# Patient Record
Sex: Male | Born: 1995 | Race: White | Hispanic: No | Marital: Single | Smoking: Current every day smoker
Health system: Southern US, Community
[De-identification: ages and names within clinical notes are randomized; demographics above are authoritative.]

## PROBLEM LIST (undated history)

## (undated) HISTORY — PX: FINGER SURGERY: SHX640

---
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
## 2013-08-13 ENCOUNTER — Emergency Department (HOSPITAL_COMMUNITY)
Admission: EM | Admit: 2013-08-13 | Discharge: 2013-08-13 | Disposition: A | Discharge status: Home / Self Care | Payer: Medicaid Other | Attending: Emergency Medicine | Admitting: Emergency Medicine

## 2013-08-13 ENCOUNTER — Encounter (HOSPITAL_COMMUNITY): Payer: Self-pay | Admitting: Emergency Medicine

## 2013-08-13 DIAGNOSIS — K089 Disorder of teeth and supporting structures, unspecified: Secondary | ICD-10-CM | POA: Insufficient documentation

## 2013-08-13 DIAGNOSIS — K029 Dental caries, unspecified: Secondary | ICD-10-CM | POA: Insufficient documentation

## 2013-08-13 DIAGNOSIS — K0889 Other specified disorders of teeth and supporting structures: Secondary | ICD-10-CM

## 2013-08-13 DIAGNOSIS — F17200 Nicotine dependence, unspecified, uncomplicated: Secondary | ICD-10-CM | POA: Insufficient documentation

## 2013-08-13 MED ORDER — HYDROCODONE-ACETAMINOPHEN 5-325 MG PO TABS
2.0000 | ORAL_TABLET | Freq: Once | ORAL | Status: AC
  Administered 2013-08-13: 2 via ORAL
  Filled 2013-08-13: qty 2

## 2013-08-13 MED ORDER — NAPROXEN 500 MG PO TABS: 500.0000 mg | ORAL_TABLET | Freq: Two times a day (BID) | ORAL | Status: AC

## 2013-08-13 MED ORDER — TRAMADOL HCL 50 MG PO TABS: 50.0000 mg | ORAL_TABLET | Freq: Four times a day (QID) | ORAL | Status: AC | PRN

## 2013-08-13 MED ORDER — PENICILLIN V POTASSIUM 500 MG PO TABS: 500.0000 mg | ORAL_TABLET | Freq: Four times a day (QID) | ORAL | Status: AC

## 2013-08-13 NOTE — ED Provider Notes (Signed)
CSN: [id number]     Arrival date & time 08/13/13  2202 History   First MD Initiated Contact with Patient 08/13/13 2217     This chart was scribed for non-physician practitioner, Antony Madura, PA-C working with Toy Baker, MD by Arlan Organ, ED Scribe. This patient was seen in room WTR8/WTR8 and the patient's care was started at 10:51 PM.   Chief Complaint  Patient presents with  . Dental Pain   The history is provided by the patient. No language interpreter was used.    HPI Comments: Mitchell Tapia is a 18 y.o. male who presents to the Emergency Department complaining of constant, moderate lower dental pain x 3 days that has progressively worsened. Pt currently describes this pain as sharp and rates it 8/10.  Pt states he broke 2 teeth off while eating a piece of pork chop.  Pain is exacerbated when attempting to eat and cold/hot fluids. He has tried Ibuprofen 800 mg without any improvement for symptoms. No inability to swallow or drooling. At this time he denies any fever, chills, neck pain, neck stiffness. Pt plans to follow up with his dentist this coming Friday. He has no pertinent past medical history. No other concerns this visit.   History reviewed. No pertinent past medical history. Past Surgical History  Procedure Laterality Date  . Finger surgery     No family history on file. History  Substance Use Topics  . Smoking status: Current Every Day Smoker -- 0.50 packs/day  . Smokeless tobacco: Not on file  . Alcohol Use: No    Review of Systems  Constitutional: Negative for fever and chills.  HENT: Positive for dental problem. Negative for trouble swallowing.   Musculoskeletal: Negative for neck pain and neck stiffness.  Psychiatric/Behavioral: Negative for confusion.    Allergies  Review of patient's allergies indicates no known allergies.  Home Medications   Prior to Admission medications   Medication Sig Start Date End Date Taking? Authorizing Provider   ibuprofen (ADVIL,MOTRIN) 800 MG tablet Take 800 mg by mouth every 8 (eight) hours as needed for moderate pain.   Yes Historical Provider, MD  naproxen (NAPROSYN) 500 MG tablet Take 1 tablet (500 mg total) by mouth 2 (two) times daily. 08/13/13   Antony Madura, PA-C  penicillin v potassium (VEETID) 500 MG tablet Take 1 tablet (500 mg total) by mouth 4 (four) times daily. 08/13/13 08/20/13  Antony Madura, PA-C  traMADol (ULTRAM) 50 MG tablet Take 1 tablet (50 mg total) by mouth every 6 (six) hours as needed. 08/13/13   Antony Madura, PA-C   Triage Vitals: BP 120/63  Pulse 95  Temp(Src) 98.6 F (37 C) (Oral)  Resp 18  SpO2 100%   Physical Exam  Nursing note and vitals reviewed. Constitutional: He is oriented to person, place, and time. He appears well-developed and well-nourished. No distress.  Nontoxic/nonseptic appearing  HENT:  Head: Normocephalic and atraumatic.  Mouth/Throat: Uvula is midline, oropharynx is clear and moist and mucous membranes are normal. No trismus in the jaw. Abnormal dentition. Dental caries present. No dental abscesses or uvula swelling. No oropharyngeal exudate.    Multiple dental caries. Uvula midline. Patient tolerating secretions without difficulty or drooling. No trismus or stridor.  Eyes: Conjunctivae and EOM are normal. No scleral icterus.  Neck: Normal range of motion. Neck supple.  No nuchal rigidity or meningismus  Pulmonary/Chest: Effort normal. No respiratory distress.  Musculoskeletal: Normal range of motion.  Neurological: He is alert and oriented to  person, place, and time. He exhibits normal muscle tone. Coordination normal.  GCS 15. Patient moves extremities without ataxia.  Skin: Skin is warm and dry. No rash noted. He is not diaphoretic. No erythema. No pallor.  Psychiatric: He has a normal mood and affect. His behavior is normal.    ED Course  Procedures (including critical care time)  DIAGNOSTIC STUDIES: Oxygen Saturation is 100% on RA, Normal  by my interpretation.    COORDINATION OF CARE: 10:52 PM-Discussed treatment plan with pt at bedside and pt agreed to plan.     Labs Review Labs Reviewed - No data to display  Imaging Review No results found.   EKG Interpretation None      MDM   Final diagnoses:  Dentalgia    Patient with toothache x 3 days. No gross abscess. Exam unconcerning for Ludwig's angina or spread of infection. Will treat with penicillin and pain medicine. Urged patient to follow-up with dentist. Return precautions provided and patient agreeable to plan with no unaddressed concerns.  I personally performed the services described in this documentation, which was scribed in my presence. The recorded information has been reviewed and is accurate.   Filed Vitals:   08/13/13 2211  BP: 120/63  Pulse: 95  Temp: 98.6 F (37 C)  TempSrc: Oral  Resp: 18  SpO2: 100%     Antony MaduraKelly Siobhan Zaro, PA-C 08/13/13 2304

## 2013-08-13 NOTE — ED Notes (Signed)
Pt states he has had pain in his lower incisors since this morning and and upper right jaw pain for the past 3 days. Pt states the teeth in his upper right jaw and his lower incisors have began to crack, causing the pain. Pt states he called his dentist, who will not be able to see him until the end of the week; he was told to come to ED if pain gets worse.

## 2013-08-13 NOTE — Discharge Instructions (Signed)
Take penicillin to cover for infection. Take naproxen as prescribed for pain control. You may take tramadol for breakthrough pain. Follow up with your dentist.  Dental Pain A tooth ache may be caused by cavities (tooth decay). Cavities expose the nerve of the tooth to air and hot or cold temperatures. It may come from an infection or abscess (also called a boil or furuncle) around your tooth. It is also often caused by dental caries (tooth decay). This causes the pain you are having. DIAGNOSIS  Your caregiver can diagnose this problem by exam. TREATMENT   If caused by an infection, it may be treated with medications which kill germs (antibiotics) and pain medications as prescribed by your caregiver. Take medications as directed.  Only take over-the-counter or prescription medicines for pain, discomfort, or fever as directed by your caregiver.  Whether the tooth ache today is caused by infection or dental disease, you should see your dentist as soon as possible for further care. SEEK MEDICAL CARE IF: The exam and treatment you received today has been provided on an emergency basis only. This is not a substitute for complete medical or dental care. If your problem worsens or new problems (symptoms) appear, and you are unable to meet with your dentist, call or return to this location. SEEK IMMEDIATE MEDICAL CARE IF:   You have a fever.  You develop redness and swelling of your face, jaw, or neck.  You are unable to open your mouth.  You have severe pain uncontrolled by pain medicine. MAKE SURE YOU:   Understand these instructions.  Will watch your condition.  Will get help right away if you are not doing well or get worse. Document Released: 01/10/2005 Document Revised: 04/04/2011 Document Reviewed: 08/29/2007 [facility] Patient Information 2015 [place], [place]. This information is not intended to replace advice given to you by your health care provider. Make sure you discuss any questions  you have with your health care provider.

## 2013-08-14 NOTE — ED Provider Notes (Signed)
Medical screening examination/treatment/procedure(s) were performed by non-physician practitioner and as supervising physician I was immediately available for consultation/collaboration.   Bobbijo Holst T Theotis Gerdeman, MD 08/14/13 1707 

## 2013-08-21 ENCOUNTER — Encounter (HOSPITAL_COMMUNITY): Payer: Self-pay | Admitting: Emergency Medicine

## 2013-08-21 ENCOUNTER — Emergency Department (INDEPENDENT_AMBULATORY_CARE_PROVIDER_SITE_OTHER)
Admission: EM | Admit: 2013-08-21 | Discharge: 2013-08-21 | Disposition: A | Discharge status: Home / Self Care | Payer: Self-pay | Source: Home / Self Care | Attending: Family Medicine | Admitting: Family Medicine

## 2013-08-21 DIAGNOSIS — K0500 Acute gingivitis, plaque induced: Secondary | ICD-10-CM

## 2013-08-21 DIAGNOSIS — K047 Periapical abscess without sinus: Secondary | ICD-10-CM

## 2013-08-21 MED ORDER — CLINDAMYCIN HCL 300 MG PO CAPS: 300.0000 mg | ORAL_CAPSULE | Freq: Three times a day (TID) | ORAL | Status: AC

## 2013-08-21 MED ORDER — DICLOFENAC POTASSIUM 50 MG PO TABS: 50.0000 mg | ORAL_TABLET | Freq: Three times a day (TID) | ORAL | Status: AC

## 2013-08-21 NOTE — ED Provider Notes (Signed)
CSN: [id number]     Arrival date & time 08/21/13  1830 History   First MD Initiated Contact with Patient 08/21/13 1853     Chief Complaint  Patient presents with  . Dental Pain   (Consider location/radiation/quality/duration/timing/severity/associated sxs/prior Treatment) Patient is a 18 y.o. male presenting with tooth pain. The history is provided by the patient.  Dental Pain Location:  Lower Lower teeth location:  18/LL 2nd molar Quality:  Throbbing Severity:  Moderate Progression:  Worsening Chronicity:  Recurrent Context: abscess, dental caries, dental fracture and poor dentition   Context comment:  [facility] and ultram , sx continue. Associated symptoms: gum swelling   Associated symptoms: no fever   Risk factors: lack of dental care and periodontal disease     History reviewed. No pertinent past medical history. Past Surgical History  Procedure Laterality Date  . Finger surgery     History reviewed. No pertinent family history. History  Substance Use Topics  . Smoking status: Current Every Day Smoker -- 0.50 packs/day  . Smokeless tobacco: Not on file  . Alcohol Use: No    Review of Systems  Constitutional: Negative.  Negative for fever.  HENT: Positive for dental problem.     Allergies  Review of patient's allergies indicates no known allergies.  Home Medications   Prior to Admission medications   Medication Sig Start Date End Date Taking? Authorizing Provider  ibuprofen (ADVIL,MOTRIN) 800 MG tablet Take 800 mg by mouth every 8 (eight) hours as needed for moderate pain.   Yes Historical Provider, MD  clindamycin (CLEOCIN) 300 MG capsule Take 1 capsule (300 mg total) by mouth 3 (three) times daily. 08/21/13   Linna HoffJames D Dyson Sevey, MD  diclofenac (CATAFLAM) 50 MG tablet Take 1 tablet (50 mg total) by mouth 3 (three) times daily. 08/21/13   Linna HoffJames D Maylyn Narvaiz, MD  naproxen (NAPROSYN) 500 MG tablet Take 1 tablet (500 mg total) by mouth 2 (two) times daily.  08/13/13   Antony MaduraKelly Humes, PA-C  traMADol (ULTRAM) 50 MG tablet Take 1 tablet (50 mg total) by mouth every 6 (six) hours as needed. 08/13/13   Antony MaduraKelly Humes, PA-C   BP 127/67  Pulse 94  Temp(Src) 99.1 F (37.3 C) (Oral)  Resp 16  SpO2 100% Physical Exam  Nursing note and vitals reviewed. Constitutional: He appears well-developed and well-nourished. He appears distressed.  HENT:  Mouth/Throat: Abnormal dentition. Dental abscesses and dental caries present.      ED Course  Procedures (including critical care time) Labs Review Labs Reviewed - No data to display  Imaging Review No results found.   MDM   1. Abscess, dental   2. Gingivitis, acute, plaque induced        Linna HoffJames D Micaylah Bertucci, MD 08/21/13 2024

## 2013-08-21 NOTE — Discharge Instructions (Signed)
Take medicine as prescribed, see your dentist as soon as possible °

## 2013-08-21 NOTE — ED Notes (Signed)
Reports having cracked tooth on bottom left side with gum pain.  Symptoms present for over a week.  No relief with ibuprofen.

## 2014-06-02 ENCOUNTER — Encounter (HOSPITAL_COMMUNITY): Payer: Self-pay

## 2014-06-02 ENCOUNTER — Emergency Department (HOSPITAL_COMMUNITY)
Admission: EM | Admit: 2014-06-02 | Discharge: 2014-06-02 | Disposition: A | Discharge status: Home / Self Care | Payer: Self-pay | Attending: Emergency Medicine | Admitting: Emergency Medicine

## 2014-06-02 ENCOUNTER — Emergency Department (HOSPITAL_COMMUNITY): Payer: Medicaid Other

## 2014-06-02 DIAGNOSIS — Y9389 Activity, other specified: Secondary | ICD-10-CM | POA: Insufficient documentation

## 2014-06-02 DIAGNOSIS — Z720 Tobacco use: Secondary | ICD-10-CM | POA: Insufficient documentation

## 2014-06-02 DIAGNOSIS — S51821A Laceration with foreign body of right forearm, initial encounter: Secondary | ICD-10-CM | POA: Insufficient documentation

## 2014-06-02 DIAGNOSIS — S41111A Laceration without foreign body of right upper arm, initial encounter: Secondary | ICD-10-CM | POA: Insufficient documentation

## 2014-06-02 DIAGNOSIS — M795 Residual foreign body in soft tissue: Secondary | ICD-10-CM

## 2014-06-02 DIAGNOSIS — W25XXXA Contact with sharp glass, initial encounter: Secondary | ICD-10-CM | POA: Insufficient documentation

## 2014-06-02 DIAGNOSIS — Y9289 Other specified places as the place of occurrence of the external cause: Secondary | ICD-10-CM | POA: Insufficient documentation

## 2014-06-02 DIAGNOSIS — Y998 Other external cause status: Secondary | ICD-10-CM | POA: Insufficient documentation

## 2014-06-02 DIAGNOSIS — Z792 Long term (current) use of antibiotics: Secondary | ICD-10-CM | POA: Insufficient documentation

## 2014-06-02 DIAGNOSIS — Z791 Long term (current) use of non-steroidal anti-inflammatories (NSAID): Secondary | ICD-10-CM | POA: Insufficient documentation

## 2014-06-02 MED ORDER — MORPHINE SULFATE 4 MG/ML IJ SOLN
6.0000 mg | Freq: Once | INTRAMUSCULAR | Status: AC
  Administered 2014-06-02: 6 mg via INTRAVENOUS

## 2014-06-02 MED ORDER — OXYCODONE-ACETAMINOPHEN 5-325 MG PO TABS: 1.0000 | ORAL_TABLET | ORAL | Status: DC | PRN

## 2014-06-02 MED ORDER — CEPHALEXIN 500 MG PO CAPS
500.0000 mg | ORAL_CAPSULE | Freq: Once | ORAL | Status: AC
  Administered 2014-06-02: 500 mg via ORAL
  Filled 2014-06-02: qty 1

## 2014-06-02 MED ORDER — MORPHINE SULFATE 4 MG/ML IJ SOLN
6.0000 mg | Freq: Once | INTRAMUSCULAR | Status: DC
  Filled 2014-06-02: qty 2

## 2014-06-02 MED ORDER — LIDOCAINE-EPINEPHRINE 1 %-1:100000 IJ SOLN
20.0000 mL | Freq: Once | INTRAMUSCULAR | Status: AC
  Administered 2014-06-02: 20 mL
  Filled 2014-06-02: qty 1

## 2014-06-02 MED ORDER — CEPHALEXIN 500 MG PO CAPS: 500.0000 mg | ORAL_CAPSULE | Freq: Four times a day (QID) | ORAL | Status: DC

## 2014-06-02 NOTE — ED Notes (Signed)
Bed: [facility] Expected date:  Expected time:  Means of arrival:  Comments: EMS 

## 2014-06-02 NOTE — ED Notes (Signed)
Pt c/o several R arm lacerations after accidentally putting his arm through a door window.  Pain score 8/10.  Pt sts last tetanus x 2 years ago.  Pressure dressing applied en route.

## 2014-06-02 NOTE — ED Notes (Signed)
Bed: [facility] Expected date:  Expected time:  Means of arrival:  Comments: EMS/pseudo seizure

## 2014-06-02 NOTE — Discharge Instructions (Signed)
You have a foreign body in your forearm. I was unable to remove this safely today. All foreign bodies do not necessarily need to be removed. Sometimes certain objects such as glass, bullet fragments, etc can stay in place and not cause any significant long term complications. If you have signs of developing an infection as discussed then your need to return for immediate evaluation. If you have persistent pain or other concerns then I recommend following up with orthopedic surgery.   Laceration Care, Adult A laceration is a cut or lesion that goes through all layers of the skin and into the tissue just beneath the skin. TREATMENT  Some lacerations may not require closure. Some lacerations may not be able to be closed due to an increased risk of infection. It is important to see your caregiver as soon as possible after an injury to minimize the risk of infection and maximize the opportunity for successful closure. If closure is appropriate, pain medicines may be given, if needed. The wound will be cleaned to help prevent infection. Your caregiver will use stitches (sutures), staples, wound glue (adhesive), or skin adhesive strips to repair the laceration. These tools bring the skin edges together to allow for faster healing and a better cosmetic outcome. However, all wounds will heal with a scar. Once the wound has healed, scarring can be minimized by covering the wound with sunscreen during the day for 1 full year. HOME CARE INSTRUCTIONS  For sutures or staples:  Keep the wound clean and dry.  If you were given a bandage (dressing), you should change it at least once a day. Also, change the dressing if it becomes wet or dirty, or as directed by your caregiver.  Wash the wound with soap and water 2 times a day. Rinse the wound off with water to remove all soap. Pat the wound dry with a clean towel.  After cleaning, apply a thin layer of the antibiotic ointment as recommended by your caregiver. This  will help prevent infection and keep the dressing from sticking.  You may shower as usual after the first 24 hours. Do not soak the wound in water until the sutures are removed.  Only take over-the-counter or prescription medicines for pain, discomfort, or fever as directed by your caregiver.  Get your sutures or staples removed as directed by your caregiver. For skin adhesive strips:  Keep the wound clean and dry.  Do not get the skin adhesive strips wet. You may bathe carefully, using caution to keep the wound dry.  If the wound gets wet, pat it dry with a clean towel.  Skin adhesive strips will fall off on their own. You may trim the strips as the wound heals. Do not remove skin adhesive strips that are still stuck to the wound. They will fall off in time. For wound adhesive:  You may briefly wet your wound in the shower or bath. Do not soak or scrub the wound. Do not swim. Avoid periods of heavy perspiration until the skin adhesive has fallen off on its own. After showering or bathing, gently pat the wound dry with a clean towel.  Do not apply liquid medicine, cream medicine, or ointment medicine to your wound while the skin adhesive is in place. This may loosen the film before your wound is healed.  If a dressing is placed over the wound, be careful not to apply tape directly over the skin adhesive. This may cause the adhesive to be pulled off before the  wound is healed.  Avoid prolonged exposure to sunlight or tanning lamps while the skin adhesive is in place. Exposure to ultraviolet light in the first year will darken the scar.  The skin adhesive will usually remain in place for 5 to 10 days, then naturally fall off the skin. Do not pick at the adhesive film. You may need a tetanus shot if:  You cannot remember when you had your last tetanus shot.  You have never had a tetanus shot. If you get a tetanus shot, your arm may swell, get red, and feel warm to the touch. This is  common and not a problem. If you need a tetanus shot and you choose not to have one, there is a rare chance of getting tetanus. Sickness from tetanus can be serious. SEEK MEDICAL CARE IF:   You have redness, swelling, or increasing pain in the wound.  You see a red line that goes away from the wound.  You have yellowish-white fluid (pus) coming from the wound.  You have a fever.  You notice a bad smell coming from the wound or dressing.  Your wound breaks open before or after sutures have been removed.  You notice something coming out of the wound such as wood or glass.  Your wound is on your hand or foot and you cannot move a finger or toe. SEEK IMMEDIATE MEDICAL CARE IF:   Your pain is not controlled with prescribed medicine.  You have severe swelling around the wound causing pain and numbness or a change in color in your arm, hand, leg, or foot.  Your wound splits open and starts bleeding.  You have worsening numbness, weakness, or loss of function of any joint around or beyond the wound.  You develop painful lumps near the wound or on the skin anywhere on your body. MAKE SURE YOU:   Understand these instructions.  Will watch your condition.  Will get help right away if you are not doing well or get worse. Document Released: 01/10/2005 Document Revised: 04/04/2011 Document Reviewed: 07/06/2010 [facility] Patient Information 2015 [employer] [place], [place]. This information is not intended to replace advice given to you by your health care provider. Make sure you discuss any questions you have with your health care provider.

## 2014-06-02 NOTE — ED Notes (Signed)
Suture cart at bedside 

## 2014-06-02 NOTE — ED Provider Notes (Signed)
CSN: [id number]     Arrival date & time 06/02/14  1828 History   First MD Initiated Contact with Patient 06/02/14 1936     Chief Complaint  Patient presents with  . Arm Lacerations      (Consider location/radiation/quality/duration/timing/severity/associated sxs/prior Treatment) HPI   19yM with lacerations to RUE. Happened just before arrival. Accidentally put arm through glass panel of a door. Several laceration to forearm/upper arm. Numbness to entire arm but says this started shortly after arm was tightly wrapped to control bleeding. No FB sensation. Tetanus up to date.   History reviewed. No pertinent past medical history. Past Surgical History  Procedure Laterality Date  . Finger surgery     History reviewed. No pertinent family history. History  Substance Use Topics  . Smoking status: Current Every Day Smoker -- 0.50 packs/day  . Smokeless tobacco: Not on file  . Alcohol Use: No    Review of Systems  All systems reviewed and negative, other than as noted in HPI.   Allergies  Review of patient's allergies indicates no known allergies.  Home Medications   Prior to Admission medications   Medication Sig Start Date End Date Taking? Authorizing Provider  clindamycin (CLEOCIN) 300 MG capsule Take 1 capsule (300 mg total) by mouth 3 (three) times daily. Patient not taking: Reported on 06/02/2014 08/21/13   Linna Hoff, MD  diclofenac (CATAFLAM) 50 MG tablet Take 1 tablet (50 mg total) by mouth 3 (three) times daily. Patient not taking: Reported on 06/02/2014 08/21/13   Linna Hoff, MD  naproxen (NAPROSYN) 500 MG tablet Take 1 tablet (500 mg total) by mouth 2 (two) times daily. Patient not taking: Reported on 06/02/2014 08/13/13   Antony Madura, PA-C  traMADol (ULTRAM) 50 MG tablet Take 1 tablet (50 mg total) by mouth every 6 (six) hours as needed. Patient not taking: Reported on 06/02/2014 08/13/13   Antony Madura, PA-C   BP 126/73 mmHg  Pulse 71  Temp(Src) 98.3 F (36.8 C)  (Oral)  Resp 16  SpO2 99% Physical Exam  Constitutional: He appears well-developed and well-nourished. No distress.  HENT:  Head: Normocephalic and atraumatic.  Eyes: Conjunctivae are normal. Right eye exhibits no discharge. Left eye exhibits no discharge.  Neck: Neck supple.  Cardiovascular: Normal rate, regular rhythm and normal heart sounds.  Exam reveals no gallop and no friction rub.   No murmur heard. Pulmonary/Chest: Effort normal and breath sounds normal. No respiratory distress.  Abdominal: Soft. He exhibits no distension. There is no tenderness.  Musculoskeletal: He exhibits no edema or tenderness.       Arms: Several lacerations to RUE. Largest to mid posterior R arm. ~6cm and gapping. Triceps muscle visible. Slow venous oozing. 1cm laceration proximal to this. 2.5cm irregular laceration to volar aspect R forearm. 2 cm irregular laceration more proximal volar forearm.  5 cm laceration to lateral/distal upper arm.. No visible or palpable FB in any wounds. Reports numbness resolved when bandage removed. He is NVI distally to injuries.   Neurological: He is alert.  Skin: Skin is warm and dry.  Psychiatric: He has a normal mood and affect. His behavior is normal. Thought content normal.  Nursing note and vitals reviewed.   ED Course  Procedures (including critical care time)  LACERATION REPAIR Performed by: Raeford Razor Authorized by: Raeford Razor Consent: Verbal consent obtained. Risks and benefits: risks, benefits and alternatives were discussed Consent given by: patient Patient identity confirmed: provided demographic data Prepped and Draped in normal  sterile fashion Wound explored  Laceration Location: forearm  Laceration Length:2.5 cm  No Foreign Bodies seen or palpated  Anesthesia: local infiltration  Local anesthetic: lidocaine 1% w/ epi  Anesthetic total: 2 ml  Irrigation method: syringe Amount of cleaning: standard  Skin closure: 4-0  prolene  Number of sutures: single  Technique: running  Patient tolerance: Patient tolerated the procedure well with no immediate complications.  LACERATION REPAIR Performed by: Raeford RazorKOHUT, Nathanel Tallman Authorized by: Raeford RazorKOHUT, Braniyah Besse Consent: Verbal consent obtained. Risks and benefits: risks, benefits and alternatives were discussed Consent given by: patient Patient identity confirmed: provided demographic data Prepped and Draped in normal sterile fashion Wound explored  Laceration Location: proximal forearm  Laceration Length: 2 cm  No Foreign Bodies seen or palpated  Anesthesia: local infiltration  Local anesthetic: lidocaine 1% w epinephrine  Anesthetic total: 2 ml  Irrigation method: syringe Amount of cleaning: standard  Skin closure: 4-0 prolene  Number of sutures: 3  Technique: 2 simple interrupted horizontal mattress   Patient tolerance: Patient tolerated the procedure well with no immediate complications.  LACERATION REPAIR Performed by: Raeford RazorKOHUT, Mirko Tailor Authorized by: Raeford RazorKOHUT, Ziquan Fidel Consent: Verbal consent obtained. Risks and benefits: risks, benefits and alternatives were discussed Consent given by: patient Patient identity confirmed: provided demographic data Prepped and Draped in normal sterile fashion Wound explored  Laceration Location: posterior/distal R upper arm  Laceration Length: 6 cm  No Foreign Bodies seen or palpated  Anesthesia: local infiltration  Local anesthetic: lidocaine 1 % w epinephrine  Anesthetic total: 6 ml  Irrigation method: syringe Amount of cleaning: standard  Skin closure: 4-0 vicryl deep to better approximate margins, 4-0 prolene to close skin  Number of sutures: 3 deep, 7 to close skin  Technique: simple interrupted, horizontal mattress   Patient tolerance: Patient tolerated the procedure well with no immediate complications.  LACERATION REPAIR Performed by: Raeford RazorKOHUT, Thessaly Mccullers Authorized by: Raeford RazorKOHUT, Maximiliano Cromartie Consent:  Verbal consent obtained. Risks and benefits: risks, benefits and alternatives were discussed Consent given by: patient Patient identity confirmed: provided demographic data Prepped and Draped in normal sterile fashion Wound explored  Laceration Location: distal upper arm  Laceration Length: 5 cm  No Foreign Bodies seen or palpated  Anesthesia: local infiltration  Local anesthetic: lidocaine 1 % w epinephrine  Anesthetic total: 4  ml  Irrigation method: syringe Amount of cleaning: standard  Skin closure: 4-0 prolene, 4-0 vicryl deep  Number of sutures: single for skin, 1 deep to close dead space  Technique: running  LACERATION REPAIR Performed by: Raeford RazorKOHUT, Madalena Kesecker Authorized by: Raeford RazorKOHUT, Kristina Bertone Consent: Verbal consent obtained. Risks and benefits: risks, benefits and alternatives were discussed Consent given by: patient Patient identity confirmed: provided demographic data Prepped and Draped in normal sterile fashion Wound explored  Laceration Location: posterior R upper arm  Laceration Length: 1 cm  No Foreign Bodies seen or palpated  Anesthesia: local infiltration  Local anesthetic: lidocaine 1 % w epinephrine  Anesthetic total: 1 ml  Irrigation method: syringe Amount of cleaning: standard  Skin closure: 4-0 prolene  Number of sutures: 2   Technique: simple interrupted  Patient tolerance: Patient tolerated the procedure well with no immediate complications.    Patient tolerance: Patient tolerated the procedure well with no immediate complications.   Labs Review Labs Reviewed - No data to display  Imaging Review No results found.   Dg Forearm Right  06/02/2014   CLINICAL DATA:  Right arm laceration  EXAM: RIGHT FOREARM - 2 VIEW  COMPARISON:  None.  FINDINGS: There  is a linear foreign body within the volar soft tissues of the proximal forearm measuring approximately 1.2 cm compatible with retained piece of glass. The underlying osseous structures  are normal.  IMPRESSION: Retained foreign body is identified within the volar soft tissues of the proximal forearm.   Electronically Signed   By: Signa Kellaylor  Stroud M.D.   On: 06/02/2014 20:51   Dg Humerus Right  06/02/2014   CLINICAL DATA:  Multiple right arm lacerations after accidentally putting his arm through a door window.  EXAM: RIGHT HUMERUS - 2+ VIEW  COMPARISON:  Right forearm radiographs obtained at the same time.  FINDINGS: Curvilinear foreign body in the medial, ventral soft tissues of the proximal forearm. The visualized bones have normal appearances with no fracture or dislocation.  IMPRESSION: Glass fragment in the proximal forearm.  No fracture.   Electronically Signed   By: Beckie SaltsSteven  Reid M.D.   On: 06/02/2014 20:45    EKG Interpretation None      MDM   Final diagnoses:  Lacerations of multiple sites of right arm, initial encounter  Foreign body (FB) in soft tissue    19yM with several lacerations to RUE. Initial inspection w/o evidence of FB, hard signs of significant vascular injury or neurological deficit. XR to evaluate for possible FB. Anesthetize, irrigate and explore. Need repaired. Tetanus is current.   FB noted in forearm. Wound copiously irrigated and explored. I could not readily locate FB. Pt/family aware that FB remains. Further wound care and return precautions discussed.     Raeford RazorStephen Tiffanye Hartmann, MD 06/05/14 [phone]

## 2016-07-19 ENCOUNTER — Emergency Department (HOSPITAL_COMMUNITY)
Admission: EM | Admit: 2016-07-19 | Discharge: 2016-07-19 | Disposition: A | Discharge status: Home / Self Care | Payer: No Typology Code available for payment source | Attending: Emergency Medicine | Admitting: Emergency Medicine

## 2016-07-19 ENCOUNTER — Emergency Department (HOSPITAL_COMMUNITY): Payer: No Typology Code available for payment source

## 2016-07-19 ENCOUNTER — Encounter (HOSPITAL_COMMUNITY): Payer: Self-pay

## 2016-07-19 DIAGNOSIS — T148XXA Other injury of unspecified body region, initial encounter: Secondary | ICD-10-CM

## 2016-07-19 DIAGNOSIS — Y939 Activity, unspecified: Secondary | ICD-10-CM | POA: Insufficient documentation

## 2016-07-19 DIAGNOSIS — F17200 Nicotine dependence, unspecified, uncomplicated: Secondary | ICD-10-CM | POA: Diagnosis not present

## 2016-07-19 DIAGNOSIS — S50311A Abrasion of right elbow, initial encounter: Secondary | ICD-10-CM | POA: Diagnosis not present

## 2016-07-19 DIAGNOSIS — Z79899 Other long term (current) drug therapy: Secondary | ICD-10-CM | POA: Insufficient documentation

## 2016-07-19 DIAGNOSIS — Y999 Unspecified external cause status: Secondary | ICD-10-CM | POA: Diagnosis not present

## 2016-07-19 DIAGNOSIS — Y92410 Unspecified street and highway as the place of occurrence of the external cause: Secondary | ICD-10-CM | POA: Diagnosis not present

## 2016-07-19 DIAGNOSIS — M25521 Pain in right elbow: Secondary | ICD-10-CM

## 2016-07-19 DIAGNOSIS — S59901A Unspecified injury of right elbow, initial encounter: Secondary | ICD-10-CM | POA: Diagnosis present

## 2016-07-19 MED ORDER — OXYCODONE-ACETAMINOPHEN 5-325 MG PO TABS
1.0000 | ORAL_TABLET | Freq: Once | ORAL | Status: AC
  Administered 2016-07-19: 1 via ORAL
  Filled 2016-07-19: qty 1

## 2016-07-19 MED ORDER — OXYCODONE-ACETAMINOPHEN 5-325 MG PO TABS: 1.0000 | ORAL_TABLET | Freq: Four times a day (QID) | ORAL | 0 refills | Status: DC | PRN
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
## 2016-07-19 NOTE — ED Notes (Signed)
Patient transported to X-ray 
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
## 2016-07-19 NOTE — ED Notes (Signed)
Ortho tech notified for pt.'s right posterior long arm splint , EDP explained tests results and plan of care to pt.

## 2016-07-21 ENCOUNTER — Ambulatory Visit (INDEPENDENT_AMBULATORY_CARE_PROVIDER_SITE_OTHER): Payer: Self-pay | Admitting: Orthopedic Surgery

## 2016-07-22 ENCOUNTER — Ambulatory Visit (INDEPENDENT_AMBULATORY_CARE_PROVIDER_SITE_OTHER): Payer: Self-pay

## 2016-07-22 ENCOUNTER — Ambulatory Visit (INDEPENDENT_AMBULATORY_CARE_PROVIDER_SITE_OTHER): Payer: Self-pay | Admitting: Family

## 2016-07-22 DIAGNOSIS — S52124A Nondisplaced fracture of head of right radius, initial encounter for closed fracture: Secondary | ICD-10-CM

## 2016-07-22 DIAGNOSIS — M79604 Pain in right leg: Secondary | ICD-10-CM

## 2016-07-22 MED ORDER — OXYCODONE-ACETAMINOPHEN 5-325 MG PO TABS: 1.0000 | ORAL_TABLET | Freq: Four times a day (QID) | ORAL | 0 refills | Status: DC | PRN
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
## 2016-08-01 ENCOUNTER — Telehealth (INDEPENDENT_AMBULATORY_CARE_PROVIDER_SITE_OTHER): Payer: Self-pay | Admitting: Orthopedic Surgery

## 2016-08-01 ENCOUNTER — Telehealth (INDEPENDENT_AMBULATORY_CARE_PROVIDER_SITE_OTHER): Payer: Self-pay

## 2016-08-01 ENCOUNTER — Other Ambulatory Visit (INDEPENDENT_AMBULATORY_CARE_PROVIDER_SITE_OTHER): Payer: Self-pay | Admitting: Family

## 2016-08-01 MED ORDER — OXYCODONE-ACETAMINOPHEN 5-325 MG PO TABS: 1.0000 | ORAL_TABLET | Freq: Three times a day (TID) | ORAL | 0 refills | Status: AC | PRN

## 2016-08-01 NOTE — Telephone Encounter (Signed)
I called and left voicemail apologizing for delay in call. We have to answer messages in the order received and unfortunately we are seeing patients. Hopefully insurance will cover a new prescription. But a new prescription was written and her husband was called and made aware rx was ready at 115pm today.
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
## 2016-08-01 NOTE — Telephone Encounter (Signed)
Patient's mother Marylene Land(Angela) called advised patient told her that he lost approx.  20 of his tablets. Patient's mother said she was calling for him since he has left several message with no call back. The number to contact patient is [phone]. The number to patient's mother is [phone]

## 2016-08-01 NOTE — Telephone Encounter (Signed)
Talked with patient's father and advised him that Rx was ready to be picked up at the front desk.

## 2016-08-05 ENCOUNTER — Ambulatory Visit (INDEPENDENT_AMBULATORY_CARE_PROVIDER_SITE_OTHER): Payer: No Typology Code available for payment source | Admitting: Orthopedic Surgery

## 2016-08-05 ENCOUNTER — Encounter (INDEPENDENT_AMBULATORY_CARE_PROVIDER_SITE_OTHER): Payer: Self-pay | Admitting: Orthopedic Surgery

## 2016-08-05 ENCOUNTER — Ambulatory Visit (INDEPENDENT_AMBULATORY_CARE_PROVIDER_SITE_OTHER): Payer: Self-pay

## 2016-08-05 VITALS — Ht 72.0 in | Wt 155.0 lb

## 2016-08-05 DIAGNOSIS — M25521 Pain in right elbow: Secondary | ICD-10-CM
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
## 2016-08-26 ENCOUNTER — Ambulatory Visit (INDEPENDENT_AMBULATORY_CARE_PROVIDER_SITE_OTHER): Payer: Self-pay | Admitting: Family

## 2017-03-20 ENCOUNTER — Encounter (HOSPITAL_COMMUNITY): Payer: Self-pay

## 2017-03-20 ENCOUNTER — Ambulatory Visit (HOSPITAL_COMMUNITY)
Admission: EM | Admit: 2017-03-20 | Discharge: 2017-03-20 | Disposition: A | Discharge status: Home / Self Care | Payer: Self-pay | Attending: Family Medicine | Admitting: Family Medicine

## 2017-03-20 DIAGNOSIS — K0889 Other specified disorders of teeth and supporting structures: Secondary | ICD-10-CM

## 2017-03-20 MED ORDER — AMOXICILLIN-POT CLAVULANATE 875-125 MG PO TABS: 1.0000 | ORAL_TABLET | Freq: Two times a day (BID) | ORAL | 0 refills | Status: AC

## 2017-03-20 MED ORDER — MORPHINE SULFATE (PF) 2 MG/ML IV SOLN
2.0000 mg | Freq: Once | INTRAVENOUS | Status: AC
  Administered 2017-03-20: 2 mg via INTRAMUSCULAR

## 2017-03-20 MED ORDER — HYDROCODONE-ACETAMINOPHEN 5-325 MG PO TABS: 1.0000 | ORAL_TABLET | Freq: Four times a day (QID) | ORAL | 0 refills | Status: AC | PRN

## 2017-03-20 MED ORDER — MORPHINE SULFATE (PF) 4 MG/ML IV SOLN
INTRAVENOUS | Status: AC
Start: 2017-03-20 — End: 2017-03-20
  Filled 2017-03-20: qty 1

## 2017-03-20 NOTE — ED Provider Notes (Signed)
  [facility]   [id number] 03/20/17 Arrival Time: 1017  ASSESSMENT & PLAN:  1. Pain, dental    Meds ordered this encounter  Medications  . morphine 2 MG/ML injection 2 mg  . amoxicillin-clavulanate (AUGMENTIN) 875-125 MG tablet    Sig: Take 1 tablet by mouth every 12 (twelve) hours.    Dispense:  14 tablet    Refill:  0  . HYDROcodone-acetaminophen (NORCO/VICODIN) 5-325 MG tablet    Sig: Take 1 tablet by mouth every 6 (six) hours as needed for moderate pain or severe pain.    Dispense:  10 tablet    Refill:  0   [place] Controlled Substances Registry consulted for this patient. I feel the risk/benefit ratio today is favorable for proceeding with this prescription for a controlled substance. Medication sedation precautions given.  Dental resource written instructions given. He will schedule dental evaluation as soon as possible. He may end up needing to see an oral Careers advisersurgeon.  May f/u here as needed.  Reviewed expectations re: course of current medical issues. Questions answered. Outlined signs and symptoms indicating need for more acute intervention. Patient verbalized understanding. After Visit Summary given.   SUBJECTIVE:  Mitchell Tapia is a 22 y.o. male who reports gradual onset of left upper dental pain described as aching. Present for several months. Afebrile. Tolerating PO intake but reports pain with chewing. Normal swallowing. He does not see a dentist regularly. No neck swelling or pain. OTC analgesics without relief.  ROS: As per HPI.  OBJECTIVE:  Vitals:   03/20/17 1105  BP: 127/67  Pulse: 92  Resp: 18  Temp: 97.7 F (36.5 C)  TempSrc: Oral  SpO2: 99%    General appearance: alert; appears to be in quite a bit of pain related to dental complaint HENT: normocephalic; atraumatic; dentition: poor; gingival hypertrophy and what appears to be protrusion of bone over left upper gums without areas of fluctuance; generalized tenderness to palpation Neck:  supple without LAD Lungs: normal respirations Skin: warm and dry Psychological: alert and cooperative; normal mood and affect  No Known Allergies   Social History   Socioeconomic History  . Marital status: Single    Spouse name: Not on file  . Number of children: Not on file  . Years of education: Not on file  . Highest education level: Not on file  Social Needs  . Financial resource strain: Not on file  . Food insecurity - worry: Not on file  . Food insecurity - inability: Not on file  . Transportation needs - medical: Not on file  . Transportation needs - non-medical: Not on file  Occupational History  . Not on file  Tobacco Use  . Smoking status: Current Every Day Smoker    Packs/day: 0.50  . Smokeless tobacco: Never Used  Substance and Sexual Activity  . Alcohol use: No  . Drug use: No  . Sexual activity: Not on file  Other Topics Concern  . Not on file  Social History Narrative  . Not on file    Past Surgical History:  Procedure Laterality Date  . FINGER SURGERY       Mardella LaymanHagler, Junaid Wurzer, MD 03/20/17 1153

## 2017-03-20 NOTE — ED Triage Notes (Signed)
Tooth pain on the left side of his mouth that has been hurting for a month, but has gotten worse lately. Has been taking aleve recently and it's not helping. No injury reported. No dentist either.

## 2017-03-20 NOTE — Discharge Instructions (Signed)
Be aware, pain medications may cause drowsiness. Please do not drive, operate heavy machinery or make important decisions while on this medication, it can cloud your judgement.  Please schedule a dental evaluation as soon as possible.

## 2021-01-11 DIAGNOSIS — M25531 Pain in right wrist: Secondary | ICD-10-CM | POA: Insufficient documentation

## 2021-01-11 DIAGNOSIS — Y92410 Unspecified street and highway as the place of occurrence of the external cause: Secondary | ICD-10-CM | POA: Insufficient documentation

## 2021-01-11 DIAGNOSIS — Y906 Blood alcohol level of 120-199 mg/100 ml: Secondary | ICD-10-CM | POA: Insufficient documentation

## 2021-01-11 DIAGNOSIS — F17210 Nicotine dependence, cigarettes, uncomplicated: Secondary | ICD-10-CM | POA: Insufficient documentation

## 2021-01-11 DIAGNOSIS — S0181XA Laceration without foreign body of other part of head, initial encounter: Secondary | ICD-10-CM | POA: Insufficient documentation

## 2021-01-11 DIAGNOSIS — Z79899 Other long term (current) drug therapy: Secondary | ICD-10-CM | POA: Insufficient documentation

## 2021-01-12 ENCOUNTER — Emergency Department (HOSPITAL_COMMUNITY): Payer: Self-pay

## 2021-01-12 ENCOUNTER — Emergency Department (HOSPITAL_COMMUNITY)
Admission: EM | Admit: 2021-01-12 | Discharge: 2021-01-12 | Disposition: A | Discharge status: Home / Self Care | Payer: Self-pay | Attending: Emergency Medicine | Admitting: Emergency Medicine

## 2021-01-12 ENCOUNTER — Other Ambulatory Visit: Payer: Self-pay

## 2021-01-12 DIAGNOSIS — S060XAA Concussion with loss of consciousness status unknown, initial encounter: Secondary | ICD-10-CM

## 2021-01-12 DIAGNOSIS — S0181XA Laceration without foreign body of other part of head, initial encounter: Secondary | ICD-10-CM

## 2021-01-12 LAB — CBC WITH DIFFERENTIAL/PLATELET
Abs Immature Granulocytes: 0.09 10*3/uL — ABNORMAL HIGH (ref 0.00–0.07)
Basophils Absolute: 0.1 10*3/uL (ref 0.0–0.1)
Basophils Relative: 0 %
Eosinophils Absolute: 0 10*3/uL (ref 0.0–0.5)
Eosinophils Relative: 0 %
HCT: 48.1 % (ref 39.0–52.0)
Hemoglobin: 16.7 g/dL (ref 13.0–17.0)
Immature Granulocytes: 1 %
Lymphocytes Relative: 13 %
Lymphs Abs: 2.3 10*3/uL (ref 0.7–4.0)
MCH: 30.5 pg (ref 26.0–34.0)
MCHC: 34.7 g/dL (ref 30.0–36.0)
MCV: 87.8 fL (ref 80.0–100.0)
Monocytes Absolute: 1.3 10*3/uL — ABNORMAL HIGH (ref 0.1–1.0)
Monocytes Relative: 7 %
Neutro Abs: 14.6 10*3/uL — ABNORMAL HIGH (ref 1.7–7.7)
Neutrophils Relative %: 79 %
Platelets: 348 10*3/uL (ref 150–400)
RBC: 5.48 MIL/uL (ref 4.22–5.81)
RDW: 11.9 % (ref 11.5–15.5)
WBC: 18.5 10*3/uL — ABNORMAL HIGH (ref 4.0–10.5)
nRBC: 0 % (ref 0.0–0.2)

## 2021-01-12 LAB — COMPREHENSIVE METABOLIC PANEL
ALT: 31 U/L (ref 0–44)
AST: 30 U/L (ref 15–41)
Albumin: 5 g/dL (ref 3.5–5.0)
Alkaline Phosphatase: 55 U/L (ref 38–126)
Anion gap: 8 (ref 5–15)
BUN: 11 mg/dL (ref 6–20)
CO2: 28 mmol/L (ref 22–32)
Calcium: 9.9 mg/dL (ref 8.9–10.3)
Chloride: 102 mmol/L (ref 98–111)
Creatinine, Ser: 1.12 mg/dL (ref 0.61–1.24)
GFR, Estimated: >60 mL/min (ref >60)
Glucose, Bld: 111 mg/dL — ABNORMAL HIGH (ref 70–99)
Potassium: 4.1 mmol/L (ref 3.5–5.1)
Sodium: 138 mmol/L (ref 135–145)
Total Bilirubin: 0.6 mg/dL (ref 0.3–1.2)
Total Protein: 8.4 g/dL — ABNORMAL HIGH (ref 6.5–8.1)

## 2021-01-12 LAB — RAPID URINE DRUG SCREEN, HOSP PERFORMED
Amphetamines: NOT DETECTED
Barbiturates: NOT DETECTED
Benzodiazepines: NOT DETECTED
Cocaine: NOT DETECTED
Opiates: NOT DETECTED
Tetrahydrocannabinol: POSITIVE — AB

## 2021-01-12 LAB — ETHANOL: Alcohol, Ethyl (B): 194 mg/dL — ABNORMAL HIGH (ref <10)

## 2021-01-12 MED ORDER — KETOROLAC TROMETHAMINE 30 MG/ML IJ SOLN
30.0000 mg | Freq: Once | INTRAMUSCULAR | Status: AC
  Administered 2021-01-12: 10:00:00 30 mg via INTRAMUSCULAR
  Filled 2021-01-12: qty 1

## 2021-01-12 MED ORDER — LIDOCAINE-EPINEPHRINE-TETRACAINE (LET) TOPICAL GEL
3.0000 mL | Freq: Once | TOPICAL | Status: AC
  Administered 2021-01-12: 10:00:00 3 mL via TOPICAL
  Filled 2021-01-12: qty 3

## 2021-01-12 MED ORDER — CEPHALEXIN 500 MG PO CAPS: 500.0000 mg | ORAL_CAPSULE | Freq: Four times a day (QID) | ORAL | 0 refills | Status: AC

## 2021-01-12 MED ORDER — CEPHALEXIN 500 MG PO CAPS: 500.0000 mg | ORAL_CAPSULE | Freq: Four times a day (QID) | ORAL | 0 refills | Status: DC

## 2021-01-12 MED ORDER — LIDOCAINE-EPINEPHRINE (PF) 2 %-1:200000 IJ SOLN
10.0000 mL | Freq: Once | INTRAMUSCULAR | Status: AC
  Administered 2021-01-12: 10 mL
  Filled 2021-01-12: qty 20

## 2021-01-12 NOTE — ED Notes (Signed)
Pt chin lac irrigated and cleaned.
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
## 2021-01-12 NOTE — ED Notes (Signed)
Patient verbalizes understanding of discharge instructions. Opportunity for questioning and answers were provided. Armband removed by staff, pt discharged from ED and ambulated to lobby to return home with family.  

## 2021-01-12 NOTE — Discharge Instructions (Addendum)
Your sutures will need to be removed in 7 to 10 days.  We will place you on a course of antibiotics. Ok to apply Bacitracin or Neosporin to the wound twice daily.
